# Patient Record
Sex: Female | Born: 1950 | Race: White | Hispanic: No | Marital: Married
Health system: Southern US, Community
[De-identification: ages and names within clinical notes are randomized; demographics above are authoritative.]

---

## 2007-08-06 ENCOUNTER — Encounter (INDEPENDENT_AMBULATORY_CARE_PROVIDER_SITE_OTHER): Payer: Self-pay | Admitting: Surgery

## 2007-08-06 ENCOUNTER — Inpatient Hospital Stay (HOSPITAL_COMMUNITY): Admission: RE | Admit: 2007-08-06 | Discharge: 2007-08-12 | Payer: Self-pay | Admitting: Surgery

## 2008-02-07 ENCOUNTER — Ambulatory Visit (HOSPITAL_COMMUNITY): Admission: RE | Admit: 2008-02-07 | Discharge: 2008-02-07 | Payer: Self-pay | Admitting: Surgery

## 2009-06-08 IMAGING — CR DG CHEST 2V
2 series · 2 of 2 positions shown · non-contrast
Comparison: None

CLINICAL DATA: Preop, colon polyp.
 CHEST ? 2 VIEW:

[view not recorded (1 of 2)]
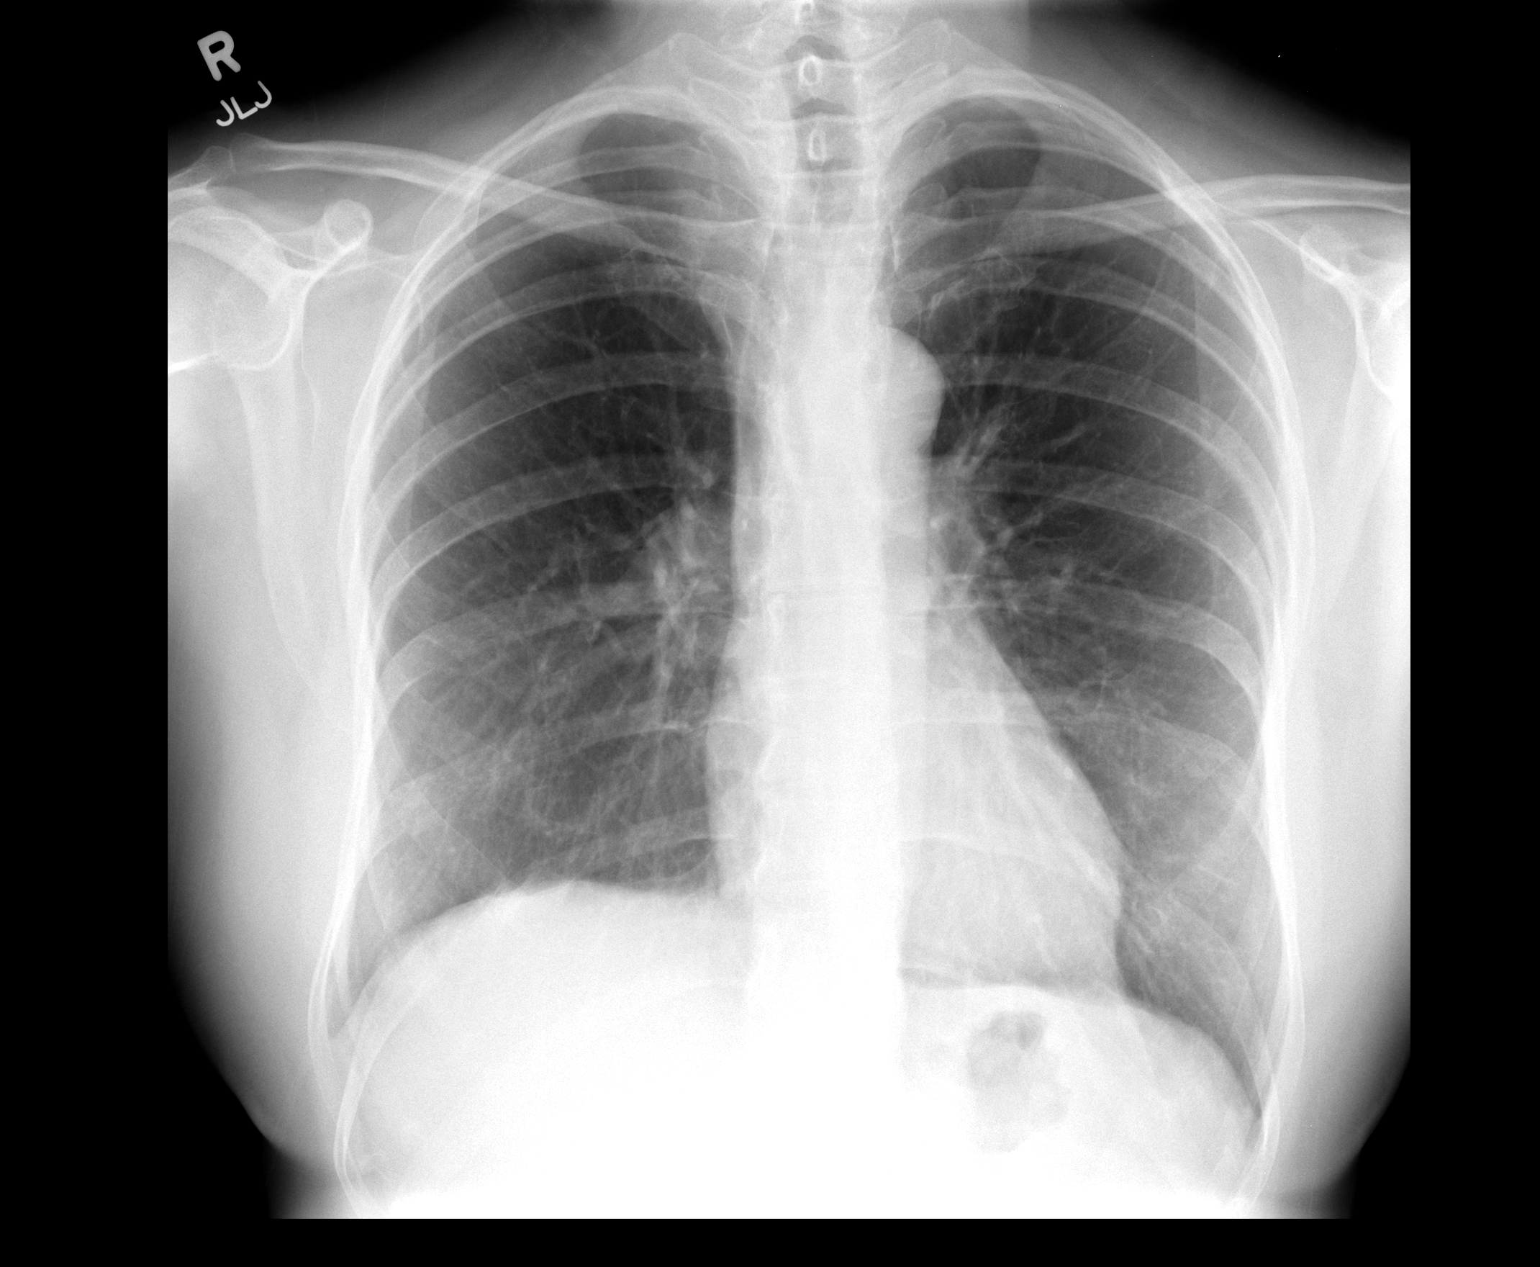

[view not recorded (2 of 2)]
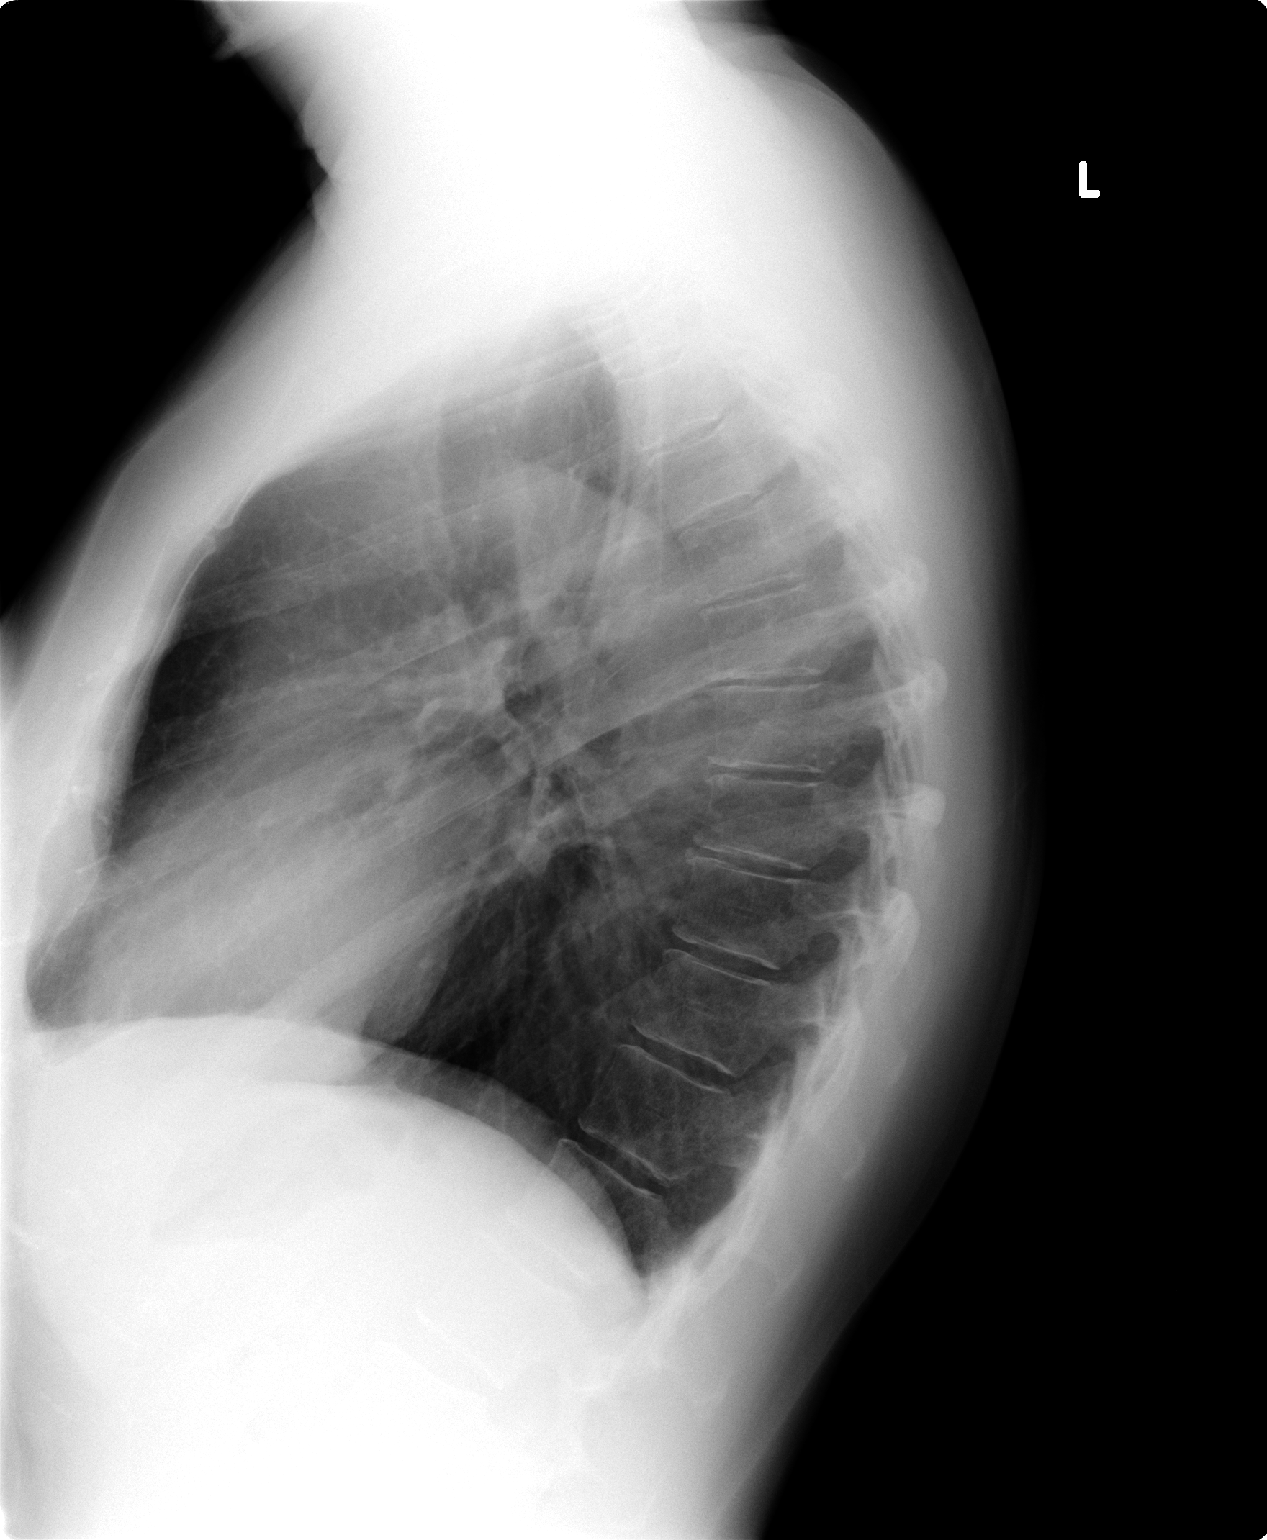

[2 of 2 positions shown; findings below may reference images not displayed]

FINDINGS: Tiny nodular density is seen in the left mid lung zone.  Lungs are otherwise clear.  No pleural fluid.
IMPRESSION: Tiny nodular density in left mid lung zone.  Followup 2-view chest in 3 months is recommended.

## 2009-12-19 IMAGING — CR DG CHEST 2V
2 series · 2 of 2 positions shown · non-contrast
Comparison: 07/28/2007

CLINICAL DATA: The fall lung nodule

CHEST - 2 VIEW

[w chest pa]
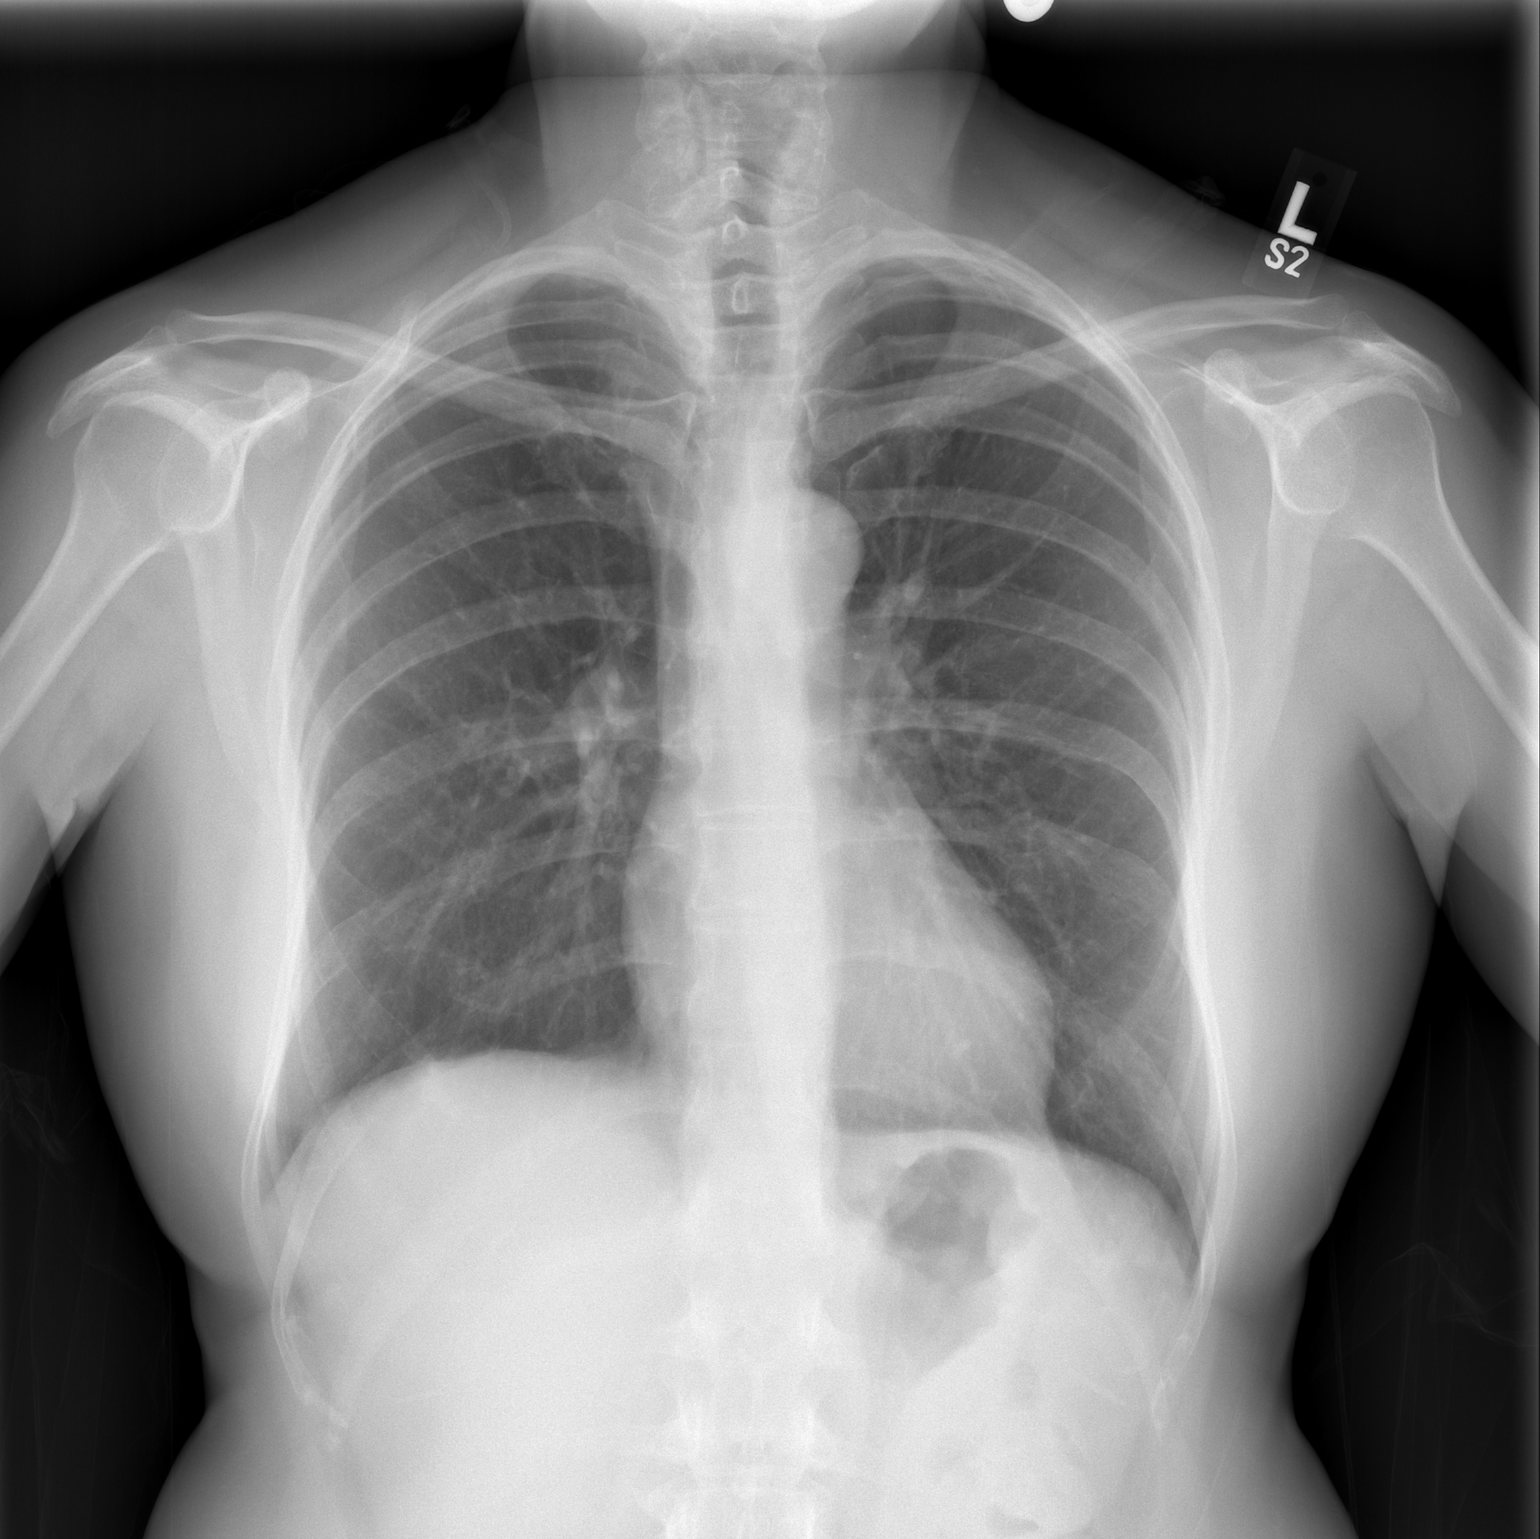

[w chest lat]
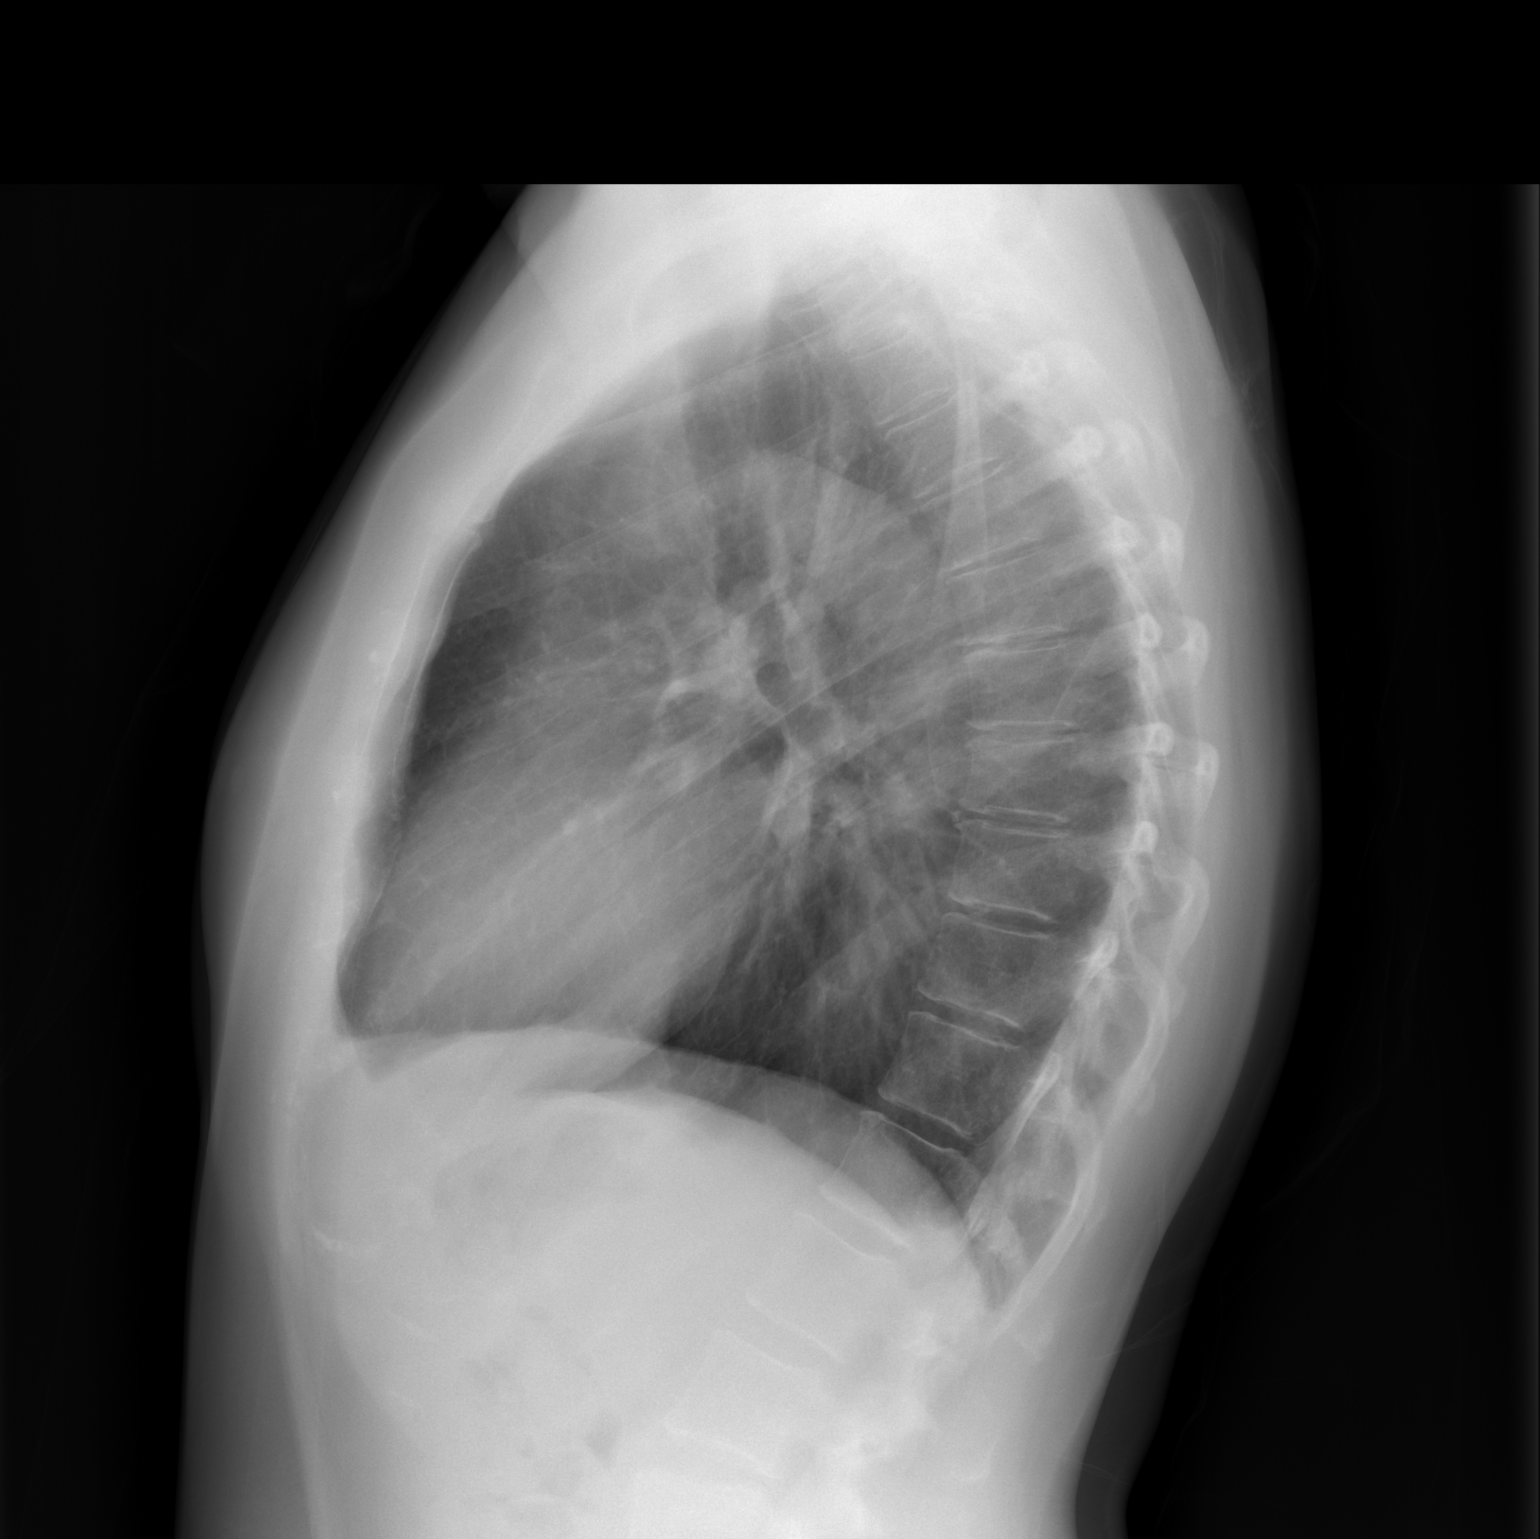

[2 of 2 positions shown; findings below may reference images not displayed]

FINDINGS: Heart and mediastinal contours normal.  Lungs clear.  No
evidence for left mid lung nodule as was noted on the prior study.
No further follow-up is needed.

No pleural fluid or osseous lesions.
IMPRESSION: No active disease - specifically no evidence for a left mid lung
nodule on the current exam.

## 2010-11-12 NOTE — Discharge Summary (Signed)
NAMEAUDINE, MANGIONE               ACCOUNT NO.:  [id number]   MEDICAL RECORD NO.:  [id number]          PATIENT TYPE:  INP   LOCATION:  1537                         FACILITY:  [facility]   PHYSICIAN:  Sandria Bales. Ezzard Standing, M.D.  DATE OF BIRTH:  [birth year]   DATE OF ADMISSION:  08/06/2007  DATE OF DISCHARGE:  08/12/2007                               DISCHARGE SUMMARY   Why can't the admission and discharge dates be typed here?   DISCHARGE DIAGNOSES:  1. 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  2. Left mid lung density, etiology unclear, followup needed.  3. Thyroid nodule followed by Dr. Lucila Maine.   OPERATIONS PERFORMED:  The patient had a laparoscopic-assisted right  hemicolectomy on August 06, 2007.   HISTORY OF PRESENT ILLNESS:  Yvonne Church is a 60 year old white female  who is a patient of Dr. Lucila Maine of [facility].  Underwent a screening colonoscopy because of her age by Dr. Jesusita Oka of  [facility] in [place], [place].  She was  found to have a 4 cm sessile mass of her right colon.  Biopsy of this  showed fragments of tubovillous adenoma without dysplasia or malignancy.  The mass was too large for colonoscopic resection.  She saw me for  discussion of resection of this segment of her colon.  I discussed with  her a laparoscopic-assisted right colon surgery.   On her preop evaluation, she was also found to have a tiny ultra-density  of left mid lung zone.  Would recommend repeat chest x-ray in 3 months  by Dr. Leanna Battles.  One other thing, she has a thyroid nodule that Dr. Lorin Picket has been  following.   PHYSICAL EXAMINATION:  GENERAL:  She is a well-nourished, pleasant white  female of average weight.  ABDOMEN:  She had an unremarkable abdominal exam.   HOSPITAL COURSE:  She completed a mechanical antibiotic bowel prep at  home.  She then presented to the [facility].  On  August 06, 2007, underwent a laparoscopic-assisted right hemicolectomy.  Dr. Rosine Beat had marked the right colon at the site of the polyp and the  marking was consistent with a polyp that was found at the time of  surgery.   Postoperatively, Ms. Gaza did well.  She had a little trouble with  urinary retention early after surgery and had to put a Foley in for 24  hours, but this has since resolved.  She has been started on clear  liquids.  She has had 1 small bowel movement.  She is passing flatus.  She is now 6 days postop.  Her incisions look good.  She is afebrile.  She has active bowel sounds and is ready for discharge.   Her final pathology was case number [id number].  It showed a large  tubovillous adenoma measuring 3.2 cm.  There was no high-grade dysplasia  or carcinoma identified.  There were 10 lymph nodes identified and all  negative for tumor.   DISCHARGE INSTRUCTIONS:  1. Regular diet.  2. She can shower.  3. I will remove her staples at the time of discharge.  4. She will see me back in 2-3 weeks.  5. She will be given Vicodin for pain.  6. She can resume her home medications including taking a multivitamin      at home.  7. She understands she needs a repeat chest x-ray in 3 months to      reevaluate this left lung density.   CONDITION ON DISCHARGE:  Good.      Sandria Bales. Ezzard Standing, M.D.  Electronically Signed     DHN/MEDQ  D:  08/12/2007  T:  08/13/2007  Job:  [id number]   cc:   Lucila Maine, MD   Jesusita Oka, MD

## 2010-11-12 NOTE — Op Note (Signed)
NAMENIGEL, WESSMAN               ACCOUNT NO.:  [id number]   MEDICAL RECORD NO.:  [id number]          PATIENT TYPE:  INP   LOCATION:  1537                         FACILITY:  [facility]   PHYSICIAN:  Sandria Bales. Ezzard Standing, M.D.  DATE OF BIRTH:  25-Mar-1951   DATE OF PROCEDURE:  08/06/2007  DATE OF DISCHARGE:                               OPERATIVE REPORT   Why can't the date of the operation be placed here?   PREOPERATIVE DIAGNOSIS:  Villous adenoma of right colon.   POSTOPERATIVE DIAGNOSIS:  Approximate 4-cm villous adenoma of right  colon, final pathology pending.   PROCEDURE PERFORMED:  Lap-assisted right hemicolectomy.   SURGEON:  Sandria Bales. Ezzard Standing, M.D.   FIRST ASSISTANT:  Marca Ancona, M.D..   ANESTHESIA:  General endotracheal, about 30 mL of 0.25% Marcaine.   INDICATIONS FOR PROCEDURE:  Ms. Kidney is a 60 year old white female  who is a patient of Dr. Lucila Maine of [facility].  She  underwent a screening colonoscopy by Dr. Jesusita Oka, a digestive health  specialist in [place], and was found to have a large polyp of the  right colon not amendable to colonoscopic resection.   I discussed with her that by proceeding with a right hemicolectomy, Dr.  Rosine Beat tattooed this lesion at the time of colonoscopy.  I discussed the  indication and potential risks with the patient.  The potential risks  include are not limited to bleeding, infection, and bowel leak, that  this could be malignant, and the possibility of ureteral injury.  I  discussed laparoscopic and open versions of surgery.   OPERATIVE NOTE:  The patient was placed in a supine position with both  arms tucked.  Foley catheter in place.  Orogastric tube in place.  She  was given 1 g of cefoxitin at initiation of the procedure.  PAS  stockings in place.  Her abdomen was prepped with Techni-Care and  sterilely draped.  A time-out was held to identify the patient and the  procedure.  I had marked her right side  preoperatively.   I accessed her abdominal cavity with an OptiVu 10 mm trocar in the left  upper quadrant and did abdominal exploration.  The right and left lobes  of the liver were unremarkable.  Stomach was unremarkable.  The bowel I  could see was really unremarkable.  She did have a tattoo mark of her  mid right colon.  This is right where her colon was tethered to her  lateral and anterior abdominal wall.   I placed 2 additional trocars -- a left lower quadrant 5-mm and a right  lower quadrant 5-mm trocar.  I did find the appendix.  Took the lateral  edge of the right colon up, mobilizing the right colon up to the hepatic  flexure, and then over across the duodenum.  I did this primarily with  hormonic scalpel.  I did identify the right ureter, and this was  posterior to my dissection.  I freed up the terminal ileum, and I took  down the right colon over off the duodenum.  When I finally had the colon adequately mobilized, I then made my  specimen incision in a periumbilical fashion.  This incision was about 5  cm in length.  I then eviscerated the bowel, identified the terminal  ileum, which I divided with a 55 GIA stapler.  I divided the hepatic  flexure with a 55 GIA stapler.  I then opened the specimen at the  bedside where Dr. Rosine Beat had marked the specimen.  I found the tumor.   The specimen was sent for permanent pathology.   I had taken the mesentery down to the appendix using both the harmonic  and the hemostat.  I found at least 1 or 2 anthracotic lymph nodes that  did not have an malignant appearance to them, but these were included  with the specimen.  I then closed the mesentery with 3-0 silk sutures.  I then did a staple side-to-side terminal ileum to hepatic flexure  anastomosis with a 55 GIA stapler.  This allowed about a 2-  fingerbreadths opening.  I then closed the enterotomy with interrupted 3-  0 silk sutures, and I tried to invert the staple ends of the of  small  bowel and colon.   I then returned the bowel to the abdominal cavity.  I irrigated with 2  liters of saline.  I was able to re-look along the right colonic gutter  and saw no bleeding.  The irrigant irrigated clear.   I removed my trocars actually when I got in and started doing the  colonic resection.  I then closed the midline wound with 2 running #1  PDS sutures.  I closed the skin with a skin staple and stapled the 3  trocar sites and then sterilely dressed them.   The patient the procedure well.  The sponge and needle count were  correct at the end of the case.  Because this was the right colon, I did  not leave an NG tube and I will get her Foley out also in the operating  room.  She will be admitted for observation.      Sandria Bales. Ezzard Standing, M.D.  Electronically Signed     DHN/MEDQ  D:  08/06/2007  T:  08/07/2007  Job:  [id number]   cc:   Dr. Lucila Maine   Dr. Jesusita Oka  [facility]  Fax [phone]

## 2011-03-20 LAB — CBC
HCT: 42.8
Hemoglobin: 14.7
MCHC: 34.3
MCV: 86
RBC: 4.97
RDW: 13.2

## 2011-03-20 LAB — COMPREHENSIVE METABOLIC PANEL
AST: 17
CO2: 26
Calcium: 9.5
Chloride: 107
Creatinine, Ser: 0.66
GFR calc Af Amer: >60
GFR calc non Af Amer: >60
Glucose, Bld: 96
Total Bilirubin: 0.8

## 2011-03-20 LAB — DIFFERENTIAL
Basophils Absolute: 0
Eosinophils Absolute: 0
Eosinophils Relative: 1
Lymphocytes Relative: 40
Neutrophils Relative %: 53

## 2011-03-21 LAB — BASIC METABOLIC PANEL
Calcium: 8.5
Chloride: 110
Creatinine, Ser: 0.63
GFR calc Af Amer: >60
Sodium: 139

## 2011-03-21 LAB — CBC
Hemoglobin: 12.1
RBC: 4.04
WBC: 7.9

## 2015-09-03 DIAGNOSIS — E039 Hypothyroidism, unspecified: Secondary | ICD-10-CM | POA: Diagnosis not present

## 2015-09-03 DIAGNOSIS — Z79899 Other long term (current) drug therapy: Secondary | ICD-10-CM | POA: Diagnosis not present

## 2015-09-17 DIAGNOSIS — Z0000 Encounter for general adult medical examination without abnormal findings: Secondary | ICD-10-CM | POA: Diagnosis not present

## 2015-09-18 DIAGNOSIS — M810 Age-related osteoporosis without current pathological fracture: Secondary | ICD-10-CM | POA: Diagnosis not present

## 2015-09-24 DIAGNOSIS — Z1231 Encounter for screening mammogram for malignant neoplasm of breast: Secondary | ICD-10-CM | POA: Diagnosis not present

## 2016-06-27 DIAGNOSIS — Z23 Encounter for immunization: Secondary | ICD-10-CM | POA: Diagnosis not present

## 2016-09-15 DIAGNOSIS — Z79899 Other long term (current) drug therapy: Secondary | ICD-10-CM | POA: Diagnosis not present

## 2016-09-15 DIAGNOSIS — E039 Hypothyroidism, unspecified: Secondary | ICD-10-CM | POA: Diagnosis not present

## 2016-09-22 DIAGNOSIS — E041 Nontoxic single thyroid nodule: Secondary | ICD-10-CM | POA: Diagnosis not present

## 2016-09-22 DIAGNOSIS — R0989 Other specified symptoms and signs involving the circulatory and respiratory systems: Secondary | ICD-10-CM | POA: Diagnosis not present

## 2016-09-22 DIAGNOSIS — M810 Age-related osteoporosis without current pathological fracture: Secondary | ICD-10-CM | POA: Diagnosis not present

## 2016-09-22 DIAGNOSIS — N952 Postmenopausal atrophic vaginitis: Secondary | ICD-10-CM | POA: Diagnosis not present

## 2016-09-22 DIAGNOSIS — Z0000 Encounter for general adult medical examination without abnormal findings: Secondary | ICD-10-CM | POA: Diagnosis not present

## 2016-09-22 DIAGNOSIS — N889 Noninflammatory disorder of cervix uteri, unspecified: Secondary | ICD-10-CM | POA: Diagnosis not present

## 2016-09-22 DIAGNOSIS — Z1389 Encounter for screening for other disorder: Secondary | ICD-10-CM | POA: Diagnosis not present

## 2016-09-22 DIAGNOSIS — Z9181 History of falling: Secondary | ICD-10-CM | POA: Diagnosis not present

## 2016-09-24 DIAGNOSIS — N952 Postmenopausal atrophic vaginitis: Secondary | ICD-10-CM | POA: Diagnosis not present

## 2016-10-06 DIAGNOSIS — R0989 Other specified symptoms and signs involving the circulatory and respiratory systems: Secondary | ICD-10-CM | POA: Diagnosis not present

## 2016-10-08 DIAGNOSIS — Z1231 Encounter for screening mammogram for malignant neoplasm of breast: Secondary | ICD-10-CM | POA: Diagnosis not present

## 2016-11-17 DIAGNOSIS — Z6823 Body mass index (BMI) 23.0-23.9, adult: Secondary | ICD-10-CM | POA: Diagnosis not present

## 2016-11-17 DIAGNOSIS — J0100 Acute maxillary sinusitis, unspecified: Secondary | ICD-10-CM | POA: Diagnosis not present

## 2016-11-17 DIAGNOSIS — J301 Allergic rhinitis due to pollen: Secondary | ICD-10-CM | POA: Diagnosis not present

## 2016-12-22 DIAGNOSIS — E041 Nontoxic single thyroid nodule: Secondary | ICD-10-CM | POA: Diagnosis not present

## 2017-05-04 DIAGNOSIS — Z23 Encounter for immunization: Secondary | ICD-10-CM | POA: Diagnosis not present

## 2017-09-21 DIAGNOSIS — Z79899 Other long term (current) drug therapy: Secondary | ICD-10-CM | POA: Diagnosis not present

## 2017-09-21 DIAGNOSIS — E041 Nontoxic single thyroid nodule: Secondary | ICD-10-CM | POA: Diagnosis not present

## 2017-09-28 DIAGNOSIS — Z0000 Encounter for general adult medical examination without abnormal findings: Secondary | ICD-10-CM | POA: Diagnosis not present

## 2017-09-28 DIAGNOSIS — Z1331 Encounter for screening for depression: Secondary | ICD-10-CM | POA: Diagnosis not present

## 2017-09-28 DIAGNOSIS — Z1339 Encounter for screening examination for other mental health and behavioral disorders: Secondary | ICD-10-CM | POA: Diagnosis not present

## 2017-09-28 DIAGNOSIS — N952 Postmenopausal atrophic vaginitis: Secondary | ICD-10-CM | POA: Diagnosis not present

## 2017-09-28 DIAGNOSIS — E049 Nontoxic goiter, unspecified: Secondary | ICD-10-CM | POA: Diagnosis not present

## 2017-09-28 DIAGNOSIS — Z6821 Body mass index (BMI) 21.0-21.9, adult: Secondary | ICD-10-CM | POA: Diagnosis not present

## 2017-09-28 DIAGNOSIS — M810 Age-related osteoporosis without current pathological fracture: Secondary | ICD-10-CM | POA: Diagnosis not present

## 2017-11-30 DIAGNOSIS — Z6821 Body mass index (BMI) 21.0-21.9, adult: Secondary | ICD-10-CM | POA: Diagnosis not present

## 2017-11-30 DIAGNOSIS — N3000 Acute cystitis without hematuria: Secondary | ICD-10-CM | POA: Diagnosis not present

## 2017-12-02 DIAGNOSIS — Z1231 Encounter for screening mammogram for malignant neoplasm of breast: Secondary | ICD-10-CM | POA: Diagnosis not present

## 2017-12-07 DIAGNOSIS — H43813 Vitreous degeneration, bilateral: Secondary | ICD-10-CM | POA: Diagnosis not present

## 2017-12-07 DIAGNOSIS — H353131 Nonexudative age-related macular degeneration, bilateral, early dry stage: Secondary | ICD-10-CM | POA: Diagnosis not present

## 2017-12-18 DIAGNOSIS — H353131 Nonexudative age-related macular degeneration, bilateral, early dry stage: Secondary | ICD-10-CM | POA: Diagnosis not present

## 2018-02-15 DIAGNOSIS — E041 Nontoxic single thyroid nodule: Secondary | ICD-10-CM | POA: Diagnosis not present

## 2018-02-15 DIAGNOSIS — E876 Hypokalemia: Secondary | ICD-10-CM | POA: Diagnosis not present

## 2018-04-23 DIAGNOSIS — H43813 Vitreous degeneration, bilateral: Secondary | ICD-10-CM | POA: Diagnosis not present

## 2018-04-23 DIAGNOSIS — H35372 Puckering of macula, left eye: Secondary | ICD-10-CM | POA: Diagnosis not present

## 2018-04-23 DIAGNOSIS — H353131 Nonexudative age-related macular degeneration, bilateral, early dry stage: Secondary | ICD-10-CM | POA: Diagnosis not present

## 2018-05-14 DIAGNOSIS — Z23 Encounter for immunization: Secondary | ICD-10-CM | POA: Diagnosis not present

## 2018-10-04 DIAGNOSIS — H409 Unspecified glaucoma: Secondary | ICD-10-CM | POA: Diagnosis not present

## 2018-10-04 DIAGNOSIS — E041 Nontoxic single thyroid nodule: Secondary | ICD-10-CM | POA: Diagnosis not present

## 2018-10-04 DIAGNOSIS — Z780 Asymptomatic menopausal state: Secondary | ICD-10-CM | POA: Diagnosis not present

## 2018-10-04 DIAGNOSIS — C50912 Malignant neoplasm of unspecified site of left female breast: Secondary | ICD-10-CM | POA: Diagnosis not present

## 2018-10-04 DIAGNOSIS — E785 Hyperlipidemia, unspecified: Secondary | ICD-10-CM | POA: Diagnosis not present

## 2018-10-04 DIAGNOSIS — Z0000 Encounter for general adult medical examination without abnormal findings: Secondary | ICD-10-CM | POA: Diagnosis not present

## 2018-10-04 DIAGNOSIS — I639 Cerebral infarction, unspecified: Secondary | ICD-10-CM | POA: Diagnosis not present

## 2018-10-04 DIAGNOSIS — J45909 Unspecified asthma, uncomplicated: Secondary | ICD-10-CM | POA: Diagnosis not present

## 2018-10-04 DIAGNOSIS — Z6821 Body mass index (BMI) 21.0-21.9, adult: Secondary | ICD-10-CM | POA: Diagnosis not present

## 2018-10-04 DIAGNOSIS — J4520 Mild intermittent asthma, uncomplicated: Secondary | ICD-10-CM | POA: Diagnosis not present

## 2018-10-04 DIAGNOSIS — Z79899 Other long term (current) drug therapy: Secondary | ICD-10-CM | POA: Diagnosis not present

## 2018-10-04 DIAGNOSIS — G459 Transient cerebral ischemic attack, unspecified: Secondary | ICD-10-CM | POA: Diagnosis not present

## 2018-11-08 DIAGNOSIS — R300 Dysuria: Secondary | ICD-10-CM | POA: Diagnosis not present

## 2018-11-08 DIAGNOSIS — Z6821 Body mass index (BMI) 21.0-21.9, adult: Secondary | ICD-10-CM | POA: Diagnosis not present

## 2018-11-17 DIAGNOSIS — Z6821 Body mass index (BMI) 21.0-21.9, adult: Secondary | ICD-10-CM | POA: Diagnosis not present

## 2018-11-17 DIAGNOSIS — N3090 Cystitis, unspecified without hematuria: Secondary | ICD-10-CM | POA: Diagnosis not present

## 2018-12-13 DIAGNOSIS — N3091 Cystitis, unspecified with hematuria: Secondary | ICD-10-CM | POA: Diagnosis not present

## 2018-12-13 DIAGNOSIS — Z6821 Body mass index (BMI) 21.0-21.9, adult: Secondary | ICD-10-CM | POA: Diagnosis not present

## 2018-12-29 DIAGNOSIS — N3289 Other specified disorders of bladder: Secondary | ICD-10-CM | POA: Diagnosis not present

## 2018-12-29 DIAGNOSIS — N390 Urinary tract infection, site not specified: Secondary | ICD-10-CM | POA: Diagnosis not present

## 2019-01-03 DIAGNOSIS — Z1231 Encounter for screening mammogram for malignant neoplasm of breast: Secondary | ICD-10-CM | POA: Diagnosis not present

## 2019-01-17 DIAGNOSIS — R921 Mammographic calcification found on diagnostic imaging of breast: Secondary | ICD-10-CM | POA: Diagnosis not present

## 2019-01-17 DIAGNOSIS — R928 Other abnormal and inconclusive findings on diagnostic imaging of breast: Secondary | ICD-10-CM | POA: Diagnosis not present

## 2019-01-17 DIAGNOSIS — N390 Urinary tract infection, site not specified: Secondary | ICD-10-CM | POA: Diagnosis not present

## 2019-01-24 DIAGNOSIS — N3289 Other specified disorders of bladder: Secondary | ICD-10-CM | POA: Diagnosis not present

## 2019-01-24 DIAGNOSIS — N3020 Other chronic cystitis without hematuria: Secondary | ICD-10-CM | POA: Diagnosis not present

## 2019-01-24 DIAGNOSIS — N952 Postmenopausal atrophic vaginitis: Secondary | ICD-10-CM | POA: Diagnosis not present

## 2019-04-27 DIAGNOSIS — N952 Postmenopausal atrophic vaginitis: Secondary | ICD-10-CM | POA: Diagnosis not present

## 2019-04-27 DIAGNOSIS — N3020 Other chronic cystitis without hematuria: Secondary | ICD-10-CM | POA: Diagnosis not present

## 2019-07-11 DIAGNOSIS — H353131 Nonexudative age-related macular degeneration, bilateral, early dry stage: Secondary | ICD-10-CM | POA: Diagnosis not present

## 2019-07-11 DIAGNOSIS — H35372 Puckering of macula, left eye: Secondary | ICD-10-CM | POA: Diagnosis not present

## 2019-07-19 DIAGNOSIS — R0789 Other chest pain: Secondary | ICD-10-CM | POA: Diagnosis not present

## 2019-07-19 DIAGNOSIS — R079 Chest pain, unspecified: Secondary | ICD-10-CM | POA: Diagnosis not present

## 2019-07-21 DIAGNOSIS — I493 Ventricular premature depolarization: Secondary | ICD-10-CM | POA: Diagnosis not present

## 2019-07-21 DIAGNOSIS — R079 Chest pain, unspecified: Secondary | ICD-10-CM | POA: Diagnosis not present

## 2019-08-05 DIAGNOSIS — I493 Ventricular premature depolarization: Secondary | ICD-10-CM | POA: Diagnosis not present

## 2019-08-05 DIAGNOSIS — R002 Palpitations: Secondary | ICD-10-CM | POA: Diagnosis not present

## 2019-08-05 DIAGNOSIS — R079 Chest pain, unspecified: Secondary | ICD-10-CM | POA: Diagnosis not present

## 2019-08-08 DIAGNOSIS — I493 Ventricular premature depolarization: Secondary | ICD-10-CM | POA: Diagnosis not present

## 2019-10-03 DIAGNOSIS — N390 Urinary tract infection, site not specified: Secondary | ICD-10-CM | POA: Diagnosis not present

## 2019-10-03 DIAGNOSIS — N952 Postmenopausal atrophic vaginitis: Secondary | ICD-10-CM | POA: Diagnosis not present

## 2019-10-17 DIAGNOSIS — L821 Other seborrheic keratosis: Secondary | ICD-10-CM | POA: Diagnosis not present

## 2019-10-17 DIAGNOSIS — L578 Other skin changes due to chronic exposure to nonionizing radiation: Secondary | ICD-10-CM | POA: Diagnosis not present

## 2019-10-17 DIAGNOSIS — L820 Inflamed seborrheic keratosis: Secondary | ICD-10-CM | POA: Diagnosis not present

## 2019-10-17 DIAGNOSIS — D2239 Melanocytic nevi of other parts of face: Secondary | ICD-10-CM | POA: Diagnosis not present

## 2019-10-17 DIAGNOSIS — D225 Melanocytic nevi of trunk: Secondary | ICD-10-CM | POA: Diagnosis not present

## 2019-11-30 DIAGNOSIS — E041 Nontoxic single thyroid nodule: Secondary | ICD-10-CM | POA: Diagnosis not present

## 2019-11-30 DIAGNOSIS — Z79899 Other long term (current) drug therapy: Secondary | ICD-10-CM | POA: Diagnosis not present

## 2019-11-30 DIAGNOSIS — M810 Age-related osteoporosis without current pathological fracture: Secondary | ICD-10-CM | POA: Diagnosis not present

## 2019-12-07 DIAGNOSIS — E041 Nontoxic single thyroid nodule: Secondary | ICD-10-CM | POA: Diagnosis not present

## 2019-12-07 DIAGNOSIS — Z780 Asymptomatic menopausal state: Secondary | ICD-10-CM | POA: Diagnosis not present

## 2019-12-07 DIAGNOSIS — Z1331 Encounter for screening for depression: Secondary | ICD-10-CM | POA: Diagnosis not present

## 2019-12-07 DIAGNOSIS — Z0000 Encounter for general adult medical examination without abnormal findings: Secondary | ICD-10-CM | POA: Diagnosis not present

## 2019-12-07 DIAGNOSIS — R300 Dysuria: Secondary | ICD-10-CM | POA: Diagnosis not present

## 2019-12-07 DIAGNOSIS — Z6821 Body mass index (BMI) 21.0-21.9, adult: Secondary | ICD-10-CM | POA: Diagnosis not present

## 2019-12-07 DIAGNOSIS — Z9181 History of falling: Secondary | ICD-10-CM | POA: Diagnosis not present

## 2019-12-28 DIAGNOSIS — H2512 Age-related nuclear cataract, left eye: Secondary | ICD-10-CM | POA: Diagnosis not present

## 2019-12-28 DIAGNOSIS — N390 Urinary tract infection, site not specified: Secondary | ICD-10-CM | POA: Diagnosis not present

## 2020-01-09 DIAGNOSIS — N3020 Other chronic cystitis without hematuria: Secondary | ICD-10-CM | POA: Diagnosis not present

## 2020-01-09 DIAGNOSIS — N952 Postmenopausal atrophic vaginitis: Secondary | ICD-10-CM | POA: Diagnosis not present

## 2020-01-18 DIAGNOSIS — Z01818 Encounter for other preprocedural examination: Secondary | ICD-10-CM | POA: Diagnosis not present

## 2020-01-18 DIAGNOSIS — H2512 Age-related nuclear cataract, left eye: Secondary | ICD-10-CM | POA: Diagnosis not present

## 2020-01-25 DIAGNOSIS — H2511 Age-related nuclear cataract, right eye: Secondary | ICD-10-CM | POA: Diagnosis not present

## 2020-02-01 DIAGNOSIS — N3289 Other specified disorders of bladder: Secondary | ICD-10-CM | POA: Diagnosis not present

## 2020-02-01 DIAGNOSIS — N390 Urinary tract infection, site not specified: Secondary | ICD-10-CM | POA: Diagnosis not present

## 2020-02-01 DIAGNOSIS — N952 Postmenopausal atrophic vaginitis: Secondary | ICD-10-CM | POA: Diagnosis not present

## 2020-02-24 DIAGNOSIS — H353131 Nonexudative age-related macular degeneration, bilateral, early dry stage: Secondary | ICD-10-CM | POA: Diagnosis not present

## 2020-03-26 ENCOUNTER — Other Ambulatory Visit: Payer: Self-pay | Admitting: Physician Assistant

## 2020-03-26 DIAGNOSIS — U071 COVID-19: Secondary | ICD-10-CM

## 2020-03-26 DIAGNOSIS — E079 Disorder of thyroid, unspecified: Secondary | ICD-10-CM

## 2020-03-26 NOTE — Progress Notes (Signed)
I connected by phone with Yvonne Church on 03/26/2020 at 3:54 PM to discuss the potential use of a new treatment for mild to moderate COVID-19 viral infection in non-hospitalized patients.  This patient is a 70 y.o. female that meets the FDA criteria for Emergency Use Authorization of COVID monoclonal antibody casirivimab/imdevimab or bamlanivimab/eteseviamb.  Has a (+) direct SARS-CoV-2 viral test result  Has mild or moderate COVID-19   Is NOT hospitalized due to COVID-19  Is within 10 days of symptom onset  Has at least one of the high risk factor(s) for progression to severe COVID-19 and/or hospitalization as defined in EUA.  Specific high risk criteria : Older age (>/= 70 yo)   I have spoken and communicated the following to the patient or parent/caregiver regarding COVID monoclonal antibody treatment:  1. FDA has authorized the emergency use for the treatment of mild to moderate COVID-19 in adults and pediatric patients with positive results of direct SARS-CoV-2 viral testing who are 31 years of age and older weighing at least 40 kg, and who are at high risk for progressing to severe COVID-19 and/or hospitalization.  2. The significant known and potential risks and benefits of COVID monoclonal antibody, and the extent to which such potential risks and benefits are unknown.  3. Information on available alternative treatments and the risks and benefits of those alternatives, including clinical trials.  4. Patients treated with COVID monoclonal antibody should continue to self-isolate and use infection control measures (e.g., wear mask, isolate, social distance, avoid sharing personal items, clean and disinfect "high touch" surfaces, and frequent handwashing) according to CDC guidelines.   5. The patient or parent/caregiver has the option to accept or refuse COVID monoclonal antibody treatment.  After reviewing this information with the patient, the patient has agreed to receive one of  the available covid 19 monoclonal antibodies and will be provided an appropriate fact sheet prior to infusion. [place], [place] 03/26/2020 3:54 PM

## 2020-03-27 ENCOUNTER — Other Ambulatory Visit (HOSPITAL_COMMUNITY): Payer: Self-pay

## 2020-03-27 ENCOUNTER — Ambulatory Visit (HOSPITAL_COMMUNITY)
Admission: RE | Admit: 2020-03-27 | Discharge: 2020-03-27 | Disposition: A | Discharge status: Home / Self Care | Payer: Medicare Other | Source: Ambulatory Visit | Attending: Pulmonary Disease | Admitting: Pulmonary Disease

## 2020-03-27 DIAGNOSIS — U071 COVID-19: Secondary | ICD-10-CM

## 2020-03-27 DIAGNOSIS — Z23 Encounter for immunization: Secondary | ICD-10-CM | POA: Insufficient documentation

## 2020-03-27 MED ORDER — ALBUTEROL SULFATE HFA 108 (90 BASE) MCG/ACT IN AERS: 2.0000 | INHALATION_SPRAY | Freq: Once | RESPIRATORY_TRACT | Status: DC | PRN

## 2020-03-27 MED ORDER — EPINEPHRINE 0.3 MG/0.3ML IJ SOAJ: 0.3000 mg | Freq: Once | INTRAMUSCULAR | Status: DC | PRN

## 2020-03-27 MED ORDER — FAMOTIDINE IN NACL 20-0.9 MG/50ML-% IV SOLN: 20.0000 mg | Freq: Once | INTRAVENOUS | Status: DC | PRN

## 2020-03-27 MED ORDER — SODIUM CHLORIDE 0.9 % IV SOLN: INTRAVENOUS | Status: DC | PRN

## 2020-03-27 MED ORDER — DIPHENHYDRAMINE HCL 50 MG/ML IJ SOLN: 50.0000 mg | Freq: Once | INTRAMUSCULAR | Status: DC | PRN

## 2020-03-27 MED ORDER — METHYLPREDNISOLONE SODIUM SUCC 125 MG IJ SOLR: 125.0000 mg | Freq: Once | INTRAMUSCULAR | Status: DC | PRN

## 2020-03-27 MED ORDER — SODIUM CHLORIDE 0.9 % IV SOLN
1200.0000 mg | Freq: Once | INTRAVENOUS | Status: AC
  Administered 2020-03-27: 1200 mg via INTRAVENOUS

## 2020-03-27 NOTE — Discharge Instructions (Signed)
10 Things You Can Do to Manage Your COVID-19 Symptoms at Home If you have possible or confirmed COVID-19: 1. Stay home from work and school. And stay away from other public places. If you must go out, avoid using any kind of public transportation, ridesharing, or taxis. 2. Monitor your symptoms carefully. If your symptoms get worse, call your healthcare provider immediately. 3. Get rest and stay hydrated. 4. If you have a medical appointment, call the healthcare provider ahead of time and tell them that you have or may have COVID-19. 5. For medical emergencies, call 911 and notify the dispatch personnel that you have or may have COVID-19. 6. Cover your cough and sneezes with a tissue or use the inside of your elbow. 7. Wash your hands often with soap and water for at least 20 seconds or clean your hands with an alcohol-based hand sanitizer that contains at least 60% alcohol. 8. As much as possible, stay in a specific room and away from other people in your home. Also, you should use a separate bathroom, if available. If you need to be around other people in or outside of the home, wear a mask. 9. Avoid sharing personal items with other people in your household, like dishes, towels, and bedding. 10. Clean all surfaces that are touched often, like counters, tabletops, and doorknobs. Use household cleaning sprays or wipes according to the label instructions. [email or web address] 12/29/2018 This information is not intended to replace advice given to you by your health care provider. Make sure you discuss any questions you have with your health care provider. Document Revised: 06/02/2019 Document Reviewed: 06/02/2019 Elsevier Patient Education  [place]. What types of side effects do monoclonal antibody drugs cause?  Common side effects  In general, the more common side effects caused by monoclonal antibody drugs include: . Allergic reactions, such as hives or itching . Flu-like signs and  symptoms, including chills, fatigue, fever, and muscle aches and pains . Nausea, vomiting . Diarrhea . Skin rashes . Low blood pressure   The CDC is recommending patients who receive monoclonal antibody treatments wait at least 90 days before being vaccinated.  Currently, there are no data on the safety and efficacy of mRNA COVID-19 vaccines in persons who received monoclonal antibodies or convalescent plasma as part of COVID-19 treatment. Based on the estimated half-life of such therapies as well as evidence suggesting that reinfection is uncommon in the 90 days after initial infection, vaccination should be deferred for at least 90 days, as a precautionary measure until additional information becomes available, to avoid interference of the antibody treatment with vaccine-induced immune responses. What types of side effects do monoclonal antibody drugs cause?  Common side effects  In general, the more common side effects caused by monoclonal antibody drugs include: . Allergic reactions, such as hives or itching . Flu-like signs and symptoms, including chills, fatigue, fever, and muscle aches and pains . Nausea, vomiting . Diarrhea . Skin rashes . Low blood pressure   The CDC is recommending patients who receive monoclonal antibody treatments wait at least 90 days before being vaccinated.  Currently, there are no data on the safety and efficacy of mRNA COVID-19 vaccines in persons who received monoclonal antibodies or convalescent plasma as part of COVID-19 treatment. Based on the estimated half-life of such therapies as well as evidence suggesting that reinfection is uncommon in the 90 days after initial infection, vaccination should be deferred for at least 90 days, as a precautionary measure until additional information  becomes available, to avoid interference of the antibody treatment with vaccine-induced immune responses.

## 2020-03-27 NOTE — Progress Notes (Signed)
  Diagnosis: COVID-19  Physician:Dr. Joya Gaskins  Procedure: [facility] Med: casirivimab\imdevimab infusion - Provided patient with casirivimab\imdevimab fact sheet for patients, parents and caregivers prior to infusion.  Complications: No immediate complications noted.  Discharge: Discharged home   Yvonne Church 03/27/2020

## 2020-05-04 DIAGNOSIS — Z86010 Personal history of colonic polyps: Secondary | ICD-10-CM | POA: Diagnosis not present

## 2020-05-04 DIAGNOSIS — Z1211 Encounter for screening for malignant neoplasm of colon: Secondary | ICD-10-CM | POA: Diagnosis not present

## 2020-05-04 DIAGNOSIS — K621 Rectal polyp: Secondary | ICD-10-CM | POA: Diagnosis not present

## 2020-07-02 DIAGNOSIS — H353132 Nonexudative age-related macular degeneration, bilateral, intermediate dry stage: Secondary | ICD-10-CM | POA: Diagnosis not present

## 2020-10-29 DIAGNOSIS — H353132 Nonexudative age-related macular degeneration, bilateral, intermediate dry stage: Secondary | ICD-10-CM | POA: Diagnosis not present

## 2020-11-05 DIAGNOSIS — L219 Seborrheic dermatitis, unspecified: Secondary | ICD-10-CM | POA: Diagnosis not present

## 2020-11-05 DIAGNOSIS — D225 Melanocytic nevi of trunk: Secondary | ICD-10-CM | POA: Diagnosis not present

## 2020-11-05 DIAGNOSIS — L578 Other skin changes due to chronic exposure to nonionizing radiation: Secondary | ICD-10-CM | POA: Diagnosis not present

## 2020-11-05 DIAGNOSIS — Z1231 Encounter for screening mammogram for malignant neoplasm of breast: Secondary | ICD-10-CM | POA: Diagnosis not present

## 2020-11-05 DIAGNOSIS — L820 Inflamed seborrheic keratosis: Secondary | ICD-10-CM | POA: Diagnosis not present

## 2020-12-03 DIAGNOSIS — E039 Hypothyroidism, unspecified: Secondary | ICD-10-CM | POA: Diagnosis not present

## 2020-12-03 DIAGNOSIS — D485 Neoplasm of uncertain behavior of skin: Secondary | ICD-10-CM | POA: Diagnosis not present

## 2020-12-03 DIAGNOSIS — Z79899 Other long term (current) drug therapy: Secondary | ICD-10-CM | POA: Diagnosis not present

## 2020-12-05 DIAGNOSIS — H353211 Exudative age-related macular degeneration, right eye, with active choroidal neovascularization: Secondary | ICD-10-CM | POA: Diagnosis not present

## 2020-12-10 DIAGNOSIS — E039 Hypothyroidism, unspecified: Secondary | ICD-10-CM | POA: Diagnosis not present

## 2020-12-10 DIAGNOSIS — Z780 Asymptomatic menopausal state: Secondary | ICD-10-CM | POA: Diagnosis not present

## 2020-12-10 DIAGNOSIS — Z1331 Encounter for screening for depression: Secondary | ICD-10-CM | POA: Diagnosis not present

## 2020-12-10 DIAGNOSIS — M8580 Other specified disorders of bone density and structure, unspecified site: Secondary | ICD-10-CM | POA: Diagnosis not present

## 2020-12-10 DIAGNOSIS — Z0000 Encounter for general adult medical examination without abnormal findings: Secondary | ICD-10-CM | POA: Diagnosis not present

## 2020-12-10 DIAGNOSIS — Z6821 Body mass index (BMI) 21.0-21.9, adult: Secondary | ICD-10-CM | POA: Diagnosis not present

## 2021-01-07 DIAGNOSIS — H353211 Exudative age-related macular degeneration, right eye, with active choroidal neovascularization: Secondary | ICD-10-CM | POA: Diagnosis not present

## 2021-02-04 DIAGNOSIS — H353211 Exudative age-related macular degeneration, right eye, with active choroidal neovascularization: Secondary | ICD-10-CM | POA: Diagnosis not present

## 2021-03-18 DIAGNOSIS — H353211 Exudative age-related macular degeneration, right eye, with active choroidal neovascularization: Secondary | ICD-10-CM | POA: Diagnosis not present

## 2021-05-13 DIAGNOSIS — H353211 Exudative age-related macular degeneration, right eye, with active choroidal neovascularization: Secondary | ICD-10-CM | POA: Diagnosis not present
